# Patient Record
Sex: Female | Born: 1990 | Hispanic: No | Marital: Single | Smoking: Never smoker
Health system: Southern US, Community
[De-identification: ages and names within clinical notes are randomized; demographics above are authoritative.]

---

## 2018-03-08 ENCOUNTER — Emergency Department
Admission: EM | Admit: 2018-03-08 | Discharge: 2018-03-09 | Disposition: A | Discharge status: Home / Self Care | Payer: Managed Care, Other (non HMO) | Attending: Emergency Medicine | Admitting: Emergency Medicine

## 2018-03-08 ENCOUNTER — Emergency Department: Payer: Managed Care, Other (non HMO)

## 2018-03-08 ENCOUNTER — Encounter: Payer: Self-pay | Admitting: Emergency Medicine

## 2018-03-08 ENCOUNTER — Other Ambulatory Visit: Payer: Self-pay

## 2018-03-08 DIAGNOSIS — R1010 Upper abdominal pain, unspecified: Secondary | ICD-10-CM | POA: Diagnosis present

## 2018-03-08 DIAGNOSIS — J181 Lobar pneumonia, unspecified organism: Secondary | ICD-10-CM | POA: Diagnosis not present

## 2018-03-08 DIAGNOSIS — J189 Pneumonia, unspecified organism: Secondary | ICD-10-CM

## 2018-03-08 LAB — COMPREHENSIVE METABOLIC PANEL
ALT: 41 U/L (ref 0–44)
AST: 32 U/L (ref 15–41)
Albumin: 4.1 g/dL (ref 3.5–5.0)
Alkaline Phosphatase: 40 U/L (ref 38–126)
Anion gap: 10 (ref 5–15)
BUN: 10 mg/dL (ref 6–20)
CO2: 22 mmol/L (ref 22–32)
Calcium: 8 mg/dL — ABNORMAL LOW (ref 8.9–10.3)
Chloride: 101 mmol/L (ref 98–111)
Creatinine, Ser: 0.86 mg/dL (ref 0.44–1.00)
GFR calc Af Amer: >60 mL/min (ref >60)
GFR calc non Af Amer: >60 mL/min (ref >60)
Glucose, Bld: 146 mg/dL — ABNORMAL HIGH (ref 70–99)
Potassium: 3.3 mmol/L — ABNORMAL LOW (ref 3.5–5.1)
Sodium: 133 mmol/L — ABNORMAL LOW (ref 135–145)
Total Bilirubin: 0.9 mg/dL (ref 0.3–1.2)
Total Protein: 7.5 g/dL (ref 6.5–8.1)

## 2018-03-08 LAB — URINALYSIS, COMPLETE (UACMP) WITH MICROSCOPIC
Bilirubin Urine: NEGATIVE
Glucose, UA: NEGATIVE mg/dL
Ketones, ur: NEGATIVE mg/dL
Nitrite: NEGATIVE
Protein, ur: 100 mg/dL — AB
SPECIFIC GRAVITY, URINE: 1.029 (ref 1.005–1.030)
pH: 6 (ref 5.0–8.0)

## 2018-03-08 LAB — POCT PREGNANCY, URINE: Preg Test, Ur: NEGATIVE

## 2018-03-08 LAB — CBC
HCT: 39.8 % (ref 36.0–46.0)
Hemoglobin: 12.9 g/dL (ref 12.0–15.0)
MCH: 30.4 pg (ref 26.0–34.0)
MCHC: 32.4 g/dL (ref 30.0–36.0)
MCV: 93.9 fL (ref 80.0–100.0)
Platelets: 238 10*3/uL (ref 150–400)
RBC: 4.24 MIL/uL (ref 3.87–5.11)
RDW: 12.2 % (ref 11.5–15.5)
WBC: 14.8 10*3/uL — ABNORMAL HIGH (ref 4.0–10.5)
nRBC: 0 % (ref 0.0–0.2)

## 2018-03-08 LAB — LIPASE, BLOOD: Lipase: 25 U/L (ref 11–51)

## 2018-03-08 MED ORDER — ACETAMINOPHEN 325 MG PO TABS
650.0000 mg | ORAL_TABLET | Freq: Once | ORAL | Status: AC
  Administered 2018-03-08: 650 mg via ORAL
  Filled 2018-03-08: qty 2

## 2018-03-08 MED ORDER — KETOROLAC TROMETHAMINE 30 MG/ML IJ SOLN
30.0000 mg | Freq: Once | INTRAMUSCULAR | Status: AC
  Administered 2018-03-08: 30 mg via INTRAVENOUS
  Filled 2018-03-08: qty 1

## 2018-03-08 MED ORDER — ONDANSETRON 4 MG PO TBDP
4.0000 mg | ORAL_TABLET | Freq: Once | ORAL | Status: DC | PRN
  Filled 2018-03-08: qty 1

## 2018-03-08 MED ORDER — SODIUM CHLORIDE 0.9 % IV BOLUS
1000.0000 mL | Freq: Once | INTRAVENOUS | Status: AC
  Administered 2018-03-08: 1000 mL via INTRAVENOUS

## 2018-03-08 NOTE — ED Triage Notes (Signed)
Pt reports has been having epigastric pain since yesterday reports has been having nausea and vomiting reports about 5 episodes of vomiting today, feels very tired pt talks in complete sentences no respiratory distress noted

## 2018-03-08 NOTE — ED Notes (Signed)
Discussed pt presentation with Dr. Lenard LancePaduchowski received verbal order to start an IV, Administered 1L/ NS, and 30mg  fo Toradol

## 2018-03-09 LAB — INFLUENZA PANEL BY PCR (TYPE A & B)
Influenza A By PCR: NEGATIVE
Influenza B By PCR: NEGATIVE

## 2018-03-09 MED ORDER — AZITHROMYCIN 500 MG PO TABS
500.0000 mg | ORAL_TABLET | Freq: Once | ORAL | Status: AC
  Administered 2018-03-09: 500 mg via ORAL
  Filled 2018-03-09: qty 1

## 2018-03-09 MED ORDER — AZITHROMYCIN 250 MG PO TABS: ORAL_TABLET | ORAL | 0 refills | Status: AC

## 2018-03-09 MED ORDER — HYDROCOD POLST-CPM POLST ER 10-8 MG/5ML PO SUER
5.0000 mL | Freq: Once | ORAL | Status: AC
  Administered 2018-03-09: 5 mL via ORAL
  Filled 2018-03-09: qty 5

## 2018-03-09 MED ORDER — BENZONATATE 100 MG PO CAPS: 100.0000 mg | ORAL_CAPSULE | Freq: Three times a day (TID) | ORAL | 0 refills | Status: AC | PRN

## 2018-03-09 NOTE — ED Provider Notes (Addendum)
[facility] Emergency Department Provider Note ____________________   First MD Initiated Contact with Patient 03/08/18 2320     (approximate)  I have reviewed the triage vital signs and the nursing notes.   HISTORY  Chief Complaint Abdominal Pain; Nausea; and Emesis    HPI Emily Parks is a 28 y.o. female presents to the emergency department with coughing fever upper abdominal discomfort nausea 5 episodes of nonbloody emesis which patient states began yesterday.  Patient does admit to receiving flu vaccine this year.  Patient denies any diarrhea.  Patient states abdominal discomfort is worse with coughing.  Past medical history No chronic medical conditions  There are no active problems to display for this patient.     Prior to Admission medications   Not on File    Allergies No known drug allergies No family history on file.  Social History Social History   Tobacco Use  . Smoking status: Never Smoker  Substance Use Topics  . Alcohol use: Yes  . Drug use: Not Currently    Review of Systems Constitutional: There for fever fever/chills Eyes: No visual changes. ENT: No sore throat. Cardiovascular: Denies chest pain. Respiratory: Denies shortness of breath.  Positive for cough Gastrointestinal: Positive for abdominal pain and vomiting.  No diarrhea.  No constipation. Genitourinary: Negative for dysuria. Musculoskeletal: Negative for neck pain.  Negative for back pain. Integumentary: Negative for rash. Neurological: Negative for headaches, focal weakness or numbness.  ____________________________________________   PHYSICAL EXAM:  VITAL SIGNS: ED Triage Vitals  Enc Vitals Group     BP 03/08/18 2116 (!) 128/56     Pulse Rate 03/08/18 2116 (!) 130     Resp 03/08/18 2116 20     Temp 03/08/18 2116 (!) 103.1 F (39.5 C)     Temp Source 03/08/18 2116 Oral     SpO2 03/08/18 2116 100 %     Weight 03/08/18 2117 77.1 kg (170 lb)   Height 03/08/18 2117 1.676 m (5\' 6" )     Head Circumference --      Peak Flow --      Pain Score 03/08/18 2117 10     Pain Loc --      Pain Edu? --      Excl. in GC? --     Constitutional: Alert and oriented.  Coughing eyes: Conjunctivae are normal.  Mouth/Throat: Mucous membranes are moist. Oropharynx non-erythematous. Neck: No stridor. Cardiovascular: Normal rate, regular rhythm. Good peripheral circulation. Grossly normal heart sounds. Respiratory: Normal respiratory effort.  No retractions. Lungs CTAB. Gastrointestinal: Soft and nontender. No distention.  Musculoskeletal: No lower extremity tenderness nor edema. No gross deformities of extremities. Neurologic:  Normal speech and language. No gross focal neurologic deficits are appreciated.  Skin:  Skin is warm, dry and intact. No rash noted. Psychiatric: Mood and affect are normal. Speech and behavior are normal.  ____________________________________________   LABS (all labs ordered are listed, but only abnormal results are displayed)  Labs Reviewed  COMPREHENSIVE METABOLIC PANEL - Abnormal; Notable for the following components:      Result Value   Sodium 133 (*)    Potassium 3.3 (*)    Glucose, Bld 146 (*)    Calcium 8.0 (*)    All other components within normal limits  CBC - Abnormal; Notable for the following components:   WBC 14.8 (*)    All other components within normal limits  URINALYSIS, COMPLETE (UACMP) WITH MICROSCOPIC - Abnormal; Notable for the following components:  Color, Urine AMBER (*)    APPearance CLOUDY (*)    Hgb urine dipstick MODERATE (*)    Protein, ur 100 (*)    Leukocytes, UA SMALL (*)    Bacteria, UA RARE (*)    All other components within normal limits  LIPASE, BLOOD  INFLUENZA PANEL BY PCR (TYPE A & B)  POC URINE PREG, ED  POCT PREGNANCY, URINE    RADIOLOGY I, [place] N Bohden Dung, personally viewed and evaluated these images (plain radiographs) as part of my medical decision making, as  well as reviewing the written report by the radiologist.  ED MD interpretation: Patchy airspace disease right lung base per radiologist.  Official radiology report(s): Dg Chest 2 View  Result Date: 03/08/2018 CLINICAL DATA:  Cough and fever EXAM: CHEST - 2 VIEW COMPARISON:  None. FINDINGS: Patchy airspace disease at the right base. No pleural effusion. Normal heart size. No pneumothorax. IMPRESSION: Patchy airspace disease at the right base may reflect mild atelectasis or small pneumonia Electronically Signed   By: Jasmine PangKim  Fujinaga M.D.   On: 03/08/2018 23:49    Procedures  ED ECG REPORT I, [place] N Adrine Hayworth, the attending physician, personally viewed and interpreted this ECG.   Date: 03/08/2018  EKG Time: 9:17 PM  Rate: 130  Rhythm: Sinus tachycardia  Axis: Normal  Intervals: Normal  ST&T Change: None  ____________________________________________   INITIAL IMPRESSION / ASSESSMENT AND PLAN / ED COURSE  As part of my medical decision making, I reviewed the following data within the electronic MEDICAL RECORD NUMBER  28 year old female presenting with above-stated history and physical exam concerning for pneumonia influenza or bronchitis.  Chest x-ray consistent with right lower lobe pneumonia.  Patient given azithromycin and Tussionex in the emergency department. ____________________________________________  FINAL CLINICAL IMPRESSION(S) / ED DIAGNOSES  Final diagnoses:  Community acquired pneumonia of right lower lobe of lung (HCC)     MEDICATIONS GIVEN DURING THIS VISIT:  Medications  ondansetron (ZOFRAN-ODT) disintegrating tablet 4 mg (has no administration in time range)  acetaminophen (TYLENOL) tablet 650 mg (650 mg Oral Given 03/08/18 2131)  sodium chloride 0.9 % bolus 1,000 mL (0 mLs Intravenous Stopped 03/08/18 2335)  ketorolac (TORADOL) 30 MG/ML injection 30 mg (30 mg Intravenous Given 03/08/18 2329)  azithromycin (ZITHROMAX) tablet 500 mg (500 mg Oral Given 03/09/18 0054)      ED Discharge Orders    None       Note:  This document was prepared using Dragon voice recognition software and may include unintentional dictation errors.    Darci CurrentBrown, [place] N, MD 03/09/18 0115    Darci CurrentBrown, [place] N, MD 03/29/18 [phone]

## 2020-09-26 IMAGING — CR DG CHEST 2V
2 series · 2 of 2 positions shown · non-contrast
Comparison: None.

CLINICAL DATA: Cough and fever

EXAM:
CHEST - 2 VIEW

[chest pa]
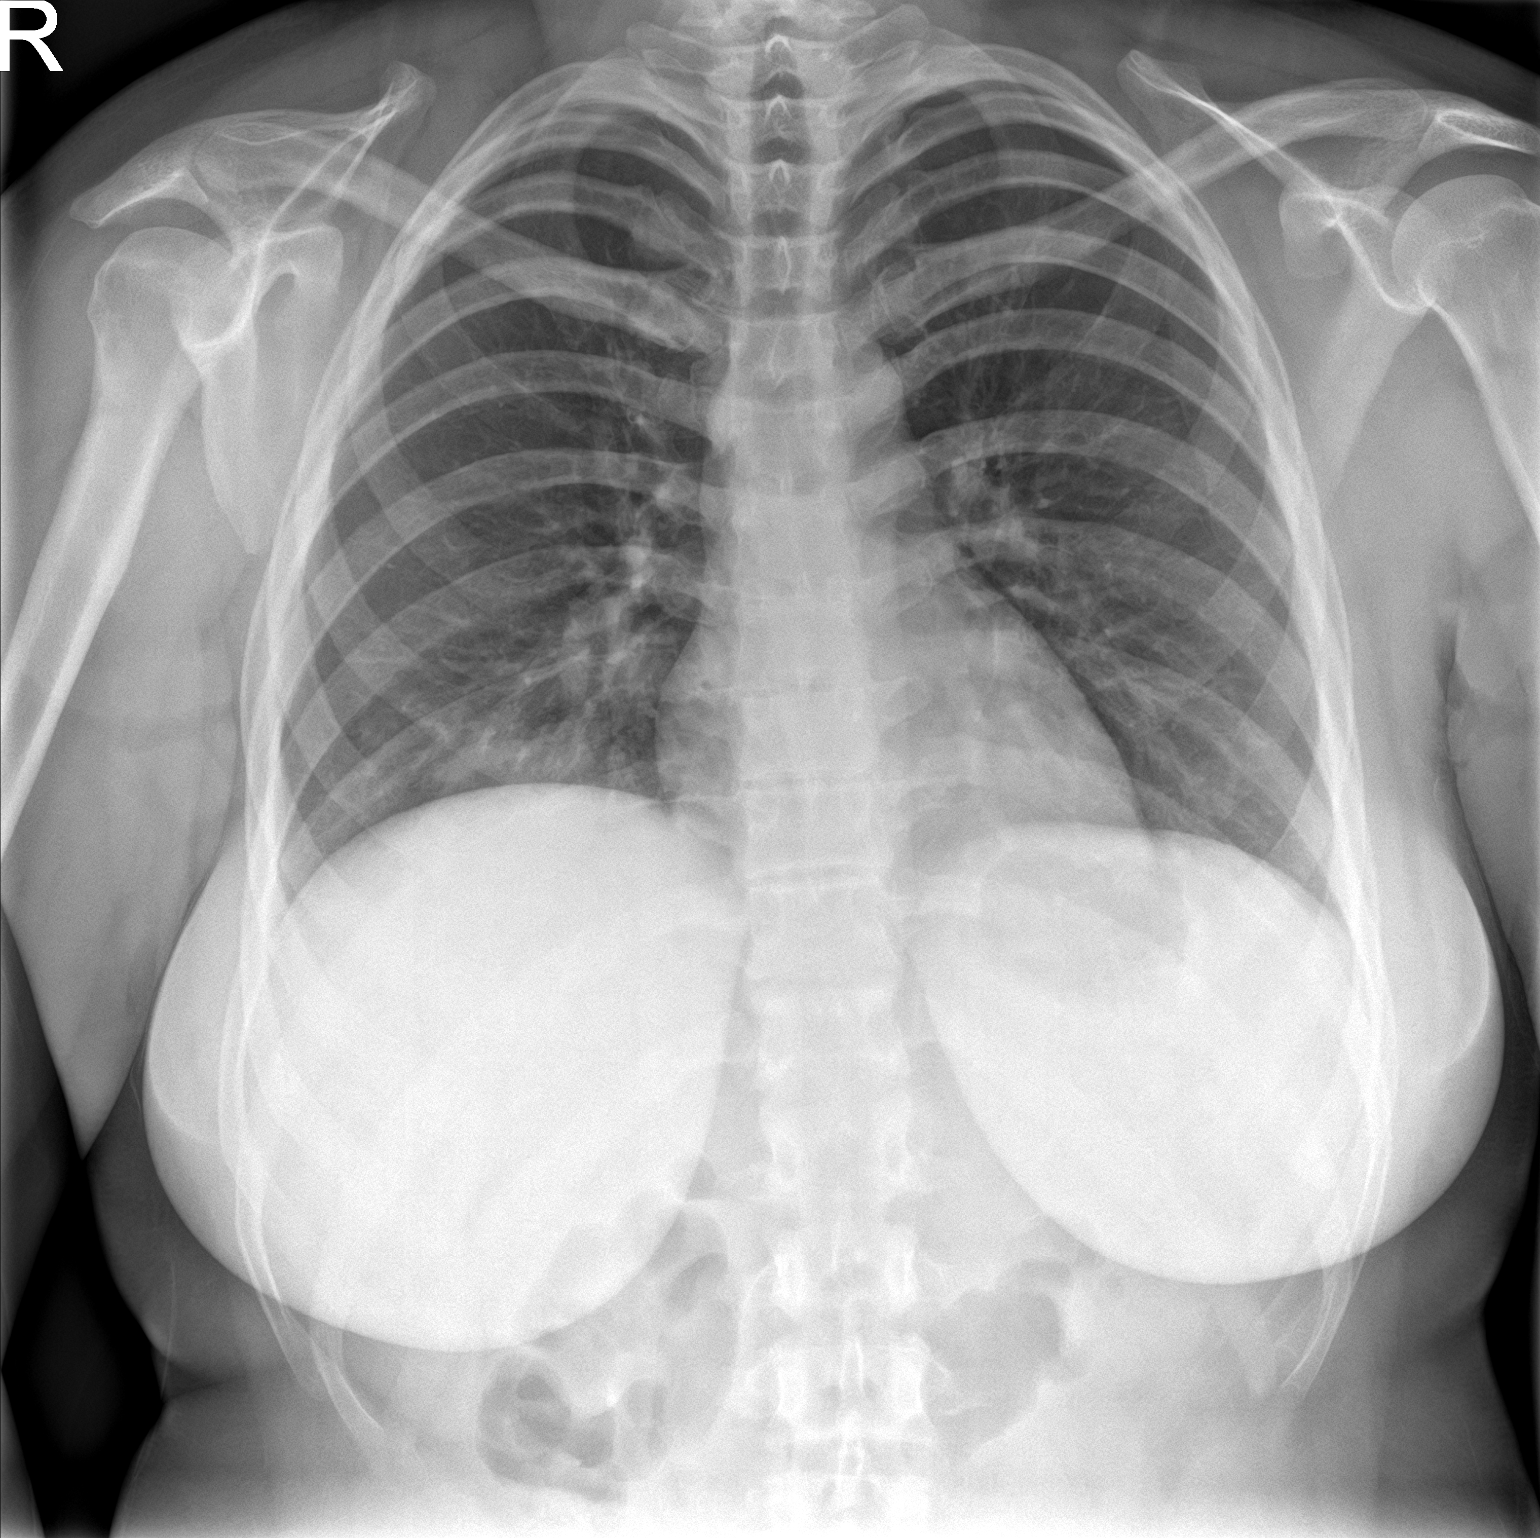

[chest lat]
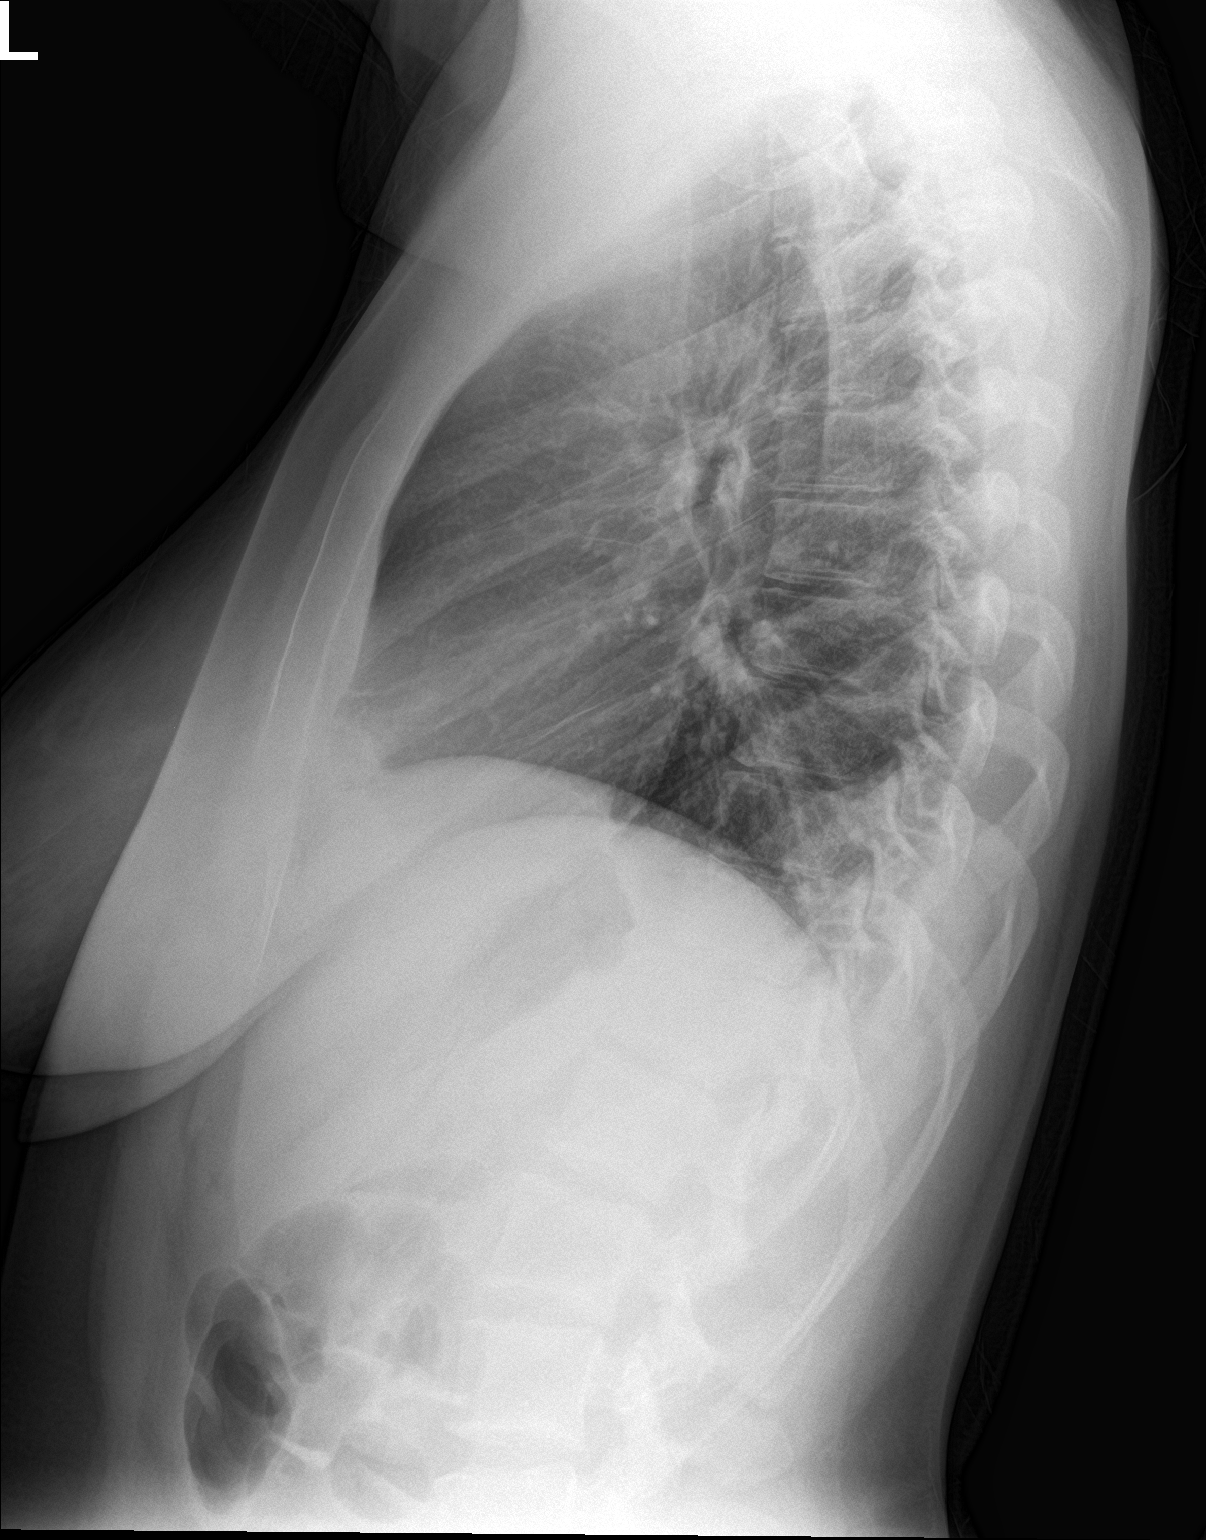

[2 of 2 positions shown; findings below may reference images not displayed]

FINDINGS: Patchy airspace disease at the right base. No pleural effusion.
Normal heart size. No pneumothorax.
IMPRESSION: Patchy airspace disease at the right base may reflect mild
atelectasis or small pneumonia
